# Patient Record
Sex: Female | Born: 1970 | Race: White | Hispanic: No | Marital: Married | Smoking: Never smoker
Health system: Southern US, Community
[De-identification: ages and names within clinical notes are randomized; demographics above are authoritative.]

---

## 2010-12-29 ENCOUNTER — Ambulatory Visit: Payer: Self-pay | Admitting: Sports Medicine

## 2011-02-08 ENCOUNTER — Ambulatory Visit (INDEPENDENT_AMBULATORY_CARE_PROVIDER_SITE_OTHER): Payer: Commercial Indemnity | Admitting: Sports Medicine

## 2011-02-08 VITALS — BP 122/70 | Ht 73.0 in | Wt 142.0 lb

## 2011-02-08 DIAGNOSIS — M25572 Pain in left ankle and joints of left foot: Secondary | ICD-10-CM | POA: Insufficient documentation

## 2011-02-08 DIAGNOSIS — M19079 Primary osteoarthritis, unspecified ankle and foot: Secondary | ICD-10-CM

## 2011-02-08 DIAGNOSIS — M25579 Pain in unspecified ankle and joints of unspecified foot: Secondary | ICD-10-CM

## 2011-02-08 DIAGNOSIS — M19072 Primary osteoarthritis, left ankle and foot: Secondary | ICD-10-CM

## 2011-02-08 DIAGNOSIS — G90529 Complex regional pain syndrome I of unspecified lower limb: Secondary | ICD-10-CM | POA: Insufficient documentation

## 2011-02-08 MED ORDER — AMITRIPTYLINE HCL 25 MG PO TABS: 25.0000 mg | ORAL_TABLET | Freq: Every day | ORAL | Status: DC

## 2011-02-08 NOTE — Assessment & Plan Note (Signed)
This needs to be followed carefully  I do not think this is likely to be a systemic form of arthritis but that does remain a possibility  If she has other joint involvement then the use of disease modifying agents might be warranted  I would like to recheck in one month

## 2011-02-08 NOTE — Progress Notes (Signed)
  Subjective:    Patient ID: Amy Brady, female    DOB: Jun 12, 1970, 40 y.o.   MRN: [id number]  HPI This patient is referred courtesy of Dr. Sunday Corn She has chronic left foot and ankle pain as documented below She currently is under evaluation by rheumatologist All blood tests for chronic rheumatoid arthritis lupus and other conditions have been negative She is considered for treatment for a seronegative arthritis but comes for another opinion to see if there may be another reason for her foot and ankle pain  L FOOT/ANKLE PAIN - Started appx 6-7 years ago after being up and down on a latter staining deck - Had pain on/off after this - On occasion would tweak ankle -- would have shooting pains in achilles area - Has had worsening of symptoms over the last 6 months - 6 months ago -- had carpet cleaned -- tip toed across carpet -- the next day had lots of pain in foot - Tweaked again in June while doing plei -- had sharp shooting pains in achilles area -- lots of pressure in midfoot - Pain was to the point of needing crutches to resolve - Sought medical attention about one and a half years ago - Plain films were unremarkable - Had MRI in Apr 2011 --  - Current Level of Activity -- Elliptical -- No more than 10 mins  MRI is reviewed and shows extensive edema in multiple bones of the rear end midfoot along with what appears to be degenerative arthritis of the subtalar joint   Review of Systems No other joints affected No fevers/chills No wt loss Otherwise neg    Objective:   Physical Exam  GEN: NAD CV: RRR No murmur PULM: CTAB  MSK  Inspection:  - Swelling noted along lateral malleolus and over forefoot of left foot.  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

## 2011-02-08 NOTE — Assessment & Plan Note (Signed)
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

## 2011-03-09 ENCOUNTER — Ambulatory Visit (INDEPENDENT_AMBULATORY_CARE_PROVIDER_SITE_OTHER): Payer: Commercial Indemnity | Admitting: Sports Medicine

## 2011-03-09 DIAGNOSIS — M19072 Primary osteoarthritis, left ankle and foot: Secondary | ICD-10-CM

## 2011-03-09 DIAGNOSIS — M25579 Pain in unspecified ankle and joints of unspecified foot: Secondary | ICD-10-CM

## 2011-03-09 DIAGNOSIS — M19079 Primary osteoarthritis, unspecified ankle and foot: Secondary | ICD-10-CM

## 2011-03-09 DIAGNOSIS — M25572 Pain in left ankle and joints of left foot: Secondary | ICD-10-CM

## 2011-03-09 MED ORDER — AMITRIPTYLINE HCL 25 MG PO TABS: ORAL_TABLET | ORAL | Status: DC

## 2011-03-09 NOTE — Assessment & Plan Note (Signed)
I encouraged more emphasis on non weight bearing exercise  She does enough walking during day and gets ankle swelling  While I think this started as RSD she has a component of midfoot DJD now and chronic ankle effusion  Easy motion  Ice at night Warm soaks other times  Try body helix compression ankle support  Reck 1 month

## 2011-03-09 NOTE — Patient Instructions (Signed)
Please ice foot when you notice swelling  Increase your amitriptyline from 25 mg to 50 mg at bedtime  Continue working our easy range of motion in your ankle and foot  Continue using ankle sleeve when you are walking a lot   The website for the different type of ankle sleeve is www.bodyhelix.com  Consider taking devil's claw  Please follow up in 2 months.  Thank you for seeing [place] today!

## 2011-03-09 NOTE — Progress Notes (Signed)
  Subjective:    Patient ID: Amy Brady, female    DOB: 12-21-1970, 40 y.o.   MRN: [id number]  HPI Pt is here to follow-up her L ankle pain which she says is not much better. The airsport brace we prescribed was causing pain so she stopped using it. There were 3 separate incidents since her last visit with [place] that caused setback to the ankle recovery; one was reaching over to pickup a child.  From those incidents, she noted the pain is anterior and midfoot and radiating down the metatarsals. Has noticed some new plantar fascia area type pain also since the incidents.  She has noticed that using her ACE compression brace has reduced the swelling some.  Takes 2 aleve q am, and pm, and amitriptyline 25 mg qd.  Originally volleyball 20 yrs ago w some ankle sprains 7 yrs ago climbing ladder and developed sharp post ankle area shooting nerve sensation Uses custom orthotics.       Review of Systems     Objective:   Physical Exam  Moderate generalized ankle effussion on lt with warmth Lt foot exam: Has pain with rotation of mid foot No pain over forefoot  Rear foot compression does not cause pain Good Plantar flexion, and dorsiflexion strength        Assessment & Plan:

## 2011-03-09 NOTE — Assessment & Plan Note (Signed)
Sharp pain at night has improved with amitriptyline Not too sedated w this  Will bump level to 50 and maybe to 75 later

## 2012-12-18 ENCOUNTER — Telehealth: Payer: Self-pay | Admitting: *Deleted

## 2012-12-18 NOTE — Telephone Encounter (Signed)
Message copied by Jacki Cones C on Mon Dec 18, 2012  2:10 PM ------      Message from: CERESI, MELANIE L      Created: Mon Dec 18, 2012 12:30 PM      Regarding: phone message      Contact: [phone]       Pt would like a call regarding a re injury of  Her left foot about 4 weeks ago.  She wanted to talk to you before scheduling an appt.            Pt asked to call number above first then try her Cell [phone] ------

## 2012-12-18 NOTE — Telephone Encounter (Signed)
States her ankle had been more painful and swollen since returning from the beach 4 weeks ago.  She was doing all the normal treatments for it as this is a chronic problem- icing, aleve bid.  She is having trouble w/ the ankle swelling not resolving like it does normal.  She is wondering if she should restart amitriptyline.  Advised her Dr. Darrick Penna would want to re-evaluate her ankle as it has been 2 years since she has been seen here.

## 2012-12-19 MED ORDER — AMITRIPTYLINE HCL 25 MG PO TABS: 25.0000 mg | ORAL_TABLET | Freq: Every day | ORAL | Status: DC

## 2012-12-19 NOTE — Telephone Encounter (Signed)
Dr. Darrick Penna wants to see pt to evaluate.  Will work her in to schedule.  He said ok to restart amitriptyline in the meantime. Pt agreeable.

## 2012-12-20 NOTE — Telephone Encounter (Addendum)
Scheduled pt for appt with Dr. Darrick Penna 12/26/12 @ 9:45am.  Pt notified of appt info.

## 2012-12-26 ENCOUNTER — Ambulatory Visit (INDEPENDENT_AMBULATORY_CARE_PROVIDER_SITE_OTHER): Payer: Managed Care, Other (non HMO) | Admitting: Sports Medicine

## 2012-12-26 ENCOUNTER — Ambulatory Visit
Admission: RE | Admit: 2012-12-26 | Discharge: 2012-12-26 | Disposition: A | Discharge status: Home / Self Care | Payer: Managed Care, Other (non HMO) | Source: Ambulatory Visit | Attending: Sports Medicine | Admitting: Sports Medicine

## 2012-12-26 VITALS — BP 110/76 | Ht 73.0 in | Wt 150.0 lb

## 2012-12-26 DIAGNOSIS — M19072 Primary osteoarthritis, left ankle and foot: Secondary | ICD-10-CM

## 2012-12-26 DIAGNOSIS — M25572 Pain in left ankle and joints of left foot: Secondary | ICD-10-CM

## 2012-12-26 DIAGNOSIS — M19079 Primary osteoarthritis, unspecified ankle and foot: Secondary | ICD-10-CM

## 2012-12-26 DIAGNOSIS — M25579 Pain in unspecified ankle and joints of unspecified foot: Secondary | ICD-10-CM

## 2012-12-26 NOTE — Progress Notes (Signed)
  Subjective:    Patient ID: Amy Brady, female    DOB: [birth year], 42 y.o.   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   Patient aggravated her ankle while walking on the beach in flip flops and barefoot 5 weeks ago Patient noted significant pain and swelling in her foot and ankle 1-2 days after Pain is deep within the subtalar joint Since that time her ankle still hurts with certain movements and the swelling has persisted Patient has been using naproxen 440 bid with occasional tylenol and ibuprofen Pt stopped using ankle compression sleeve due to increased forefoot swelling No numbness or tingling  She called and started amitriptyline 1 week ago and feels that she's had some benefit in both pain and swelling since then 2 years ago when she had very similar symptoms and had an abnormal MRI showing edema in several tarsal bones as well as an effusion of the ankle, she responded very well to the amitriptyline. In fact she went 2 years where she was able to do a lot of walking even some jogging and she remained active without much pain.    Review of Systems Per HPI     Objective:   Physical Exam General: healthy fit appearing woman who looks younger than stated age.  L foot: Appearance: grossly edemetous ankle joint, mid lateral forefoot erythema Palpation: AITFL, ATFL, CFL, PTFL, PITL non tender to palpation. Anterior tibialis tendon moderately tender to palpation, deltoid ligament non tender, Lis Franc joint mild hypertrophy at dorsum and tender with palpation. pROM: pronation/supination limited to 10-15 degrees along the subtalar joint. Dorsiflexion/plantarflexion along talocrural joint wnl. AROM: pain with resisted inversion and eversion. 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 Patient has a  significant joint effusion limiting her range of motion  -Motion exercises for ankle every day  -Use compression as much as tolerated and particularly for standing or walking  -If too much swelling in foot - take off for 1 to 2 hours  -Keep up Aleve  -Keep up the amitriptyline  -Xray of the left foot, complete  F/u in 3 weeks  -Ice and warm foot baths with motion prn -Self massage is fine  Pt care seen and discussed with Dr. Doreene Eland, PGY-3

## 2012-12-26 NOTE — Assessment & Plan Note (Addendum)
Based on today's XRay I am unsure that she has significant DJD of foot  DJD DX from MRI may have been an over-read with the bone edema from RSD

## 2012-12-26 NOTE — Assessment & Plan Note (Signed)
The response to amitriptyline strongly suggests that RSD diagnosis is likely  We will follow plan outlined in visit note

## 2012-12-26 NOTE — Patient Instructions (Addendum)
Motion exercises for ankle every day  Use compression as much as tolerated and particularly for standing or walking If too much swelling in foot - take off for 1 to 2 hours  Keep up Aleve  Keep up the amitriptyline  We will call you with Xray result  I need to see you in 3 weeks  Ice may help but see how you respond because Ice irritates nerve issues Second option are warm foot baths with motion  Self massage is fine

## 2013-01-18 ENCOUNTER — Ambulatory Visit (INDEPENDENT_AMBULATORY_CARE_PROVIDER_SITE_OTHER): Payer: Managed Care, Other (non HMO) | Admitting: Sports Medicine

## 2013-01-18 ENCOUNTER — Encounter: Payer: Self-pay | Admitting: Sports Medicine

## 2013-01-18 VITALS — BP 125/77 | HR 61 | Ht 73.0 in | Wt 150.0 lb

## 2013-01-18 DIAGNOSIS — M19072 Primary osteoarthritis, left ankle and foot: Secondary | ICD-10-CM

## 2013-01-18 DIAGNOSIS — M25579 Pain in unspecified ankle and joints of unspecified foot: Secondary | ICD-10-CM

## 2013-01-18 DIAGNOSIS — M19079 Primary osteoarthritis, unspecified ankle and foot: Secondary | ICD-10-CM

## 2013-01-18 DIAGNOSIS — M25572 Pain in left ankle and joints of left foot: Secondary | ICD-10-CM

## 2013-01-18 NOTE — Patient Instructions (Addendum)
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

## 2013-01-18 NOTE — Assessment & Plan Note (Addendum)
No evidence of DJD on Xray This appears to be reactive  Her current exam shows basically a frozen ankle She is having minimal pain but with motion of the ankle she has significant pain when she goes past 50% of normal range  I think she needs to have a long-term process to restore normal ankle function Range of motion exercises Compression Amitriptyline Vitamin C  For sports we need to make her a cushioned orthotic and get her out of rigid orthotics and we will do that on her followup visit in 6 weeks

## 2013-01-18 NOTE — Assessment & Plan Note (Signed)
Today's exam points more toward ankle issues and not foot  90% of pain has resolved with minimal use of medicines

## 2013-01-18 NOTE — Progress Notes (Signed)
  Subjective:    Patient ID: Amy Brady, female    DOB: March 29, 1971, 42 y.o.   MRN: [id number]  HPI  Pt presents to clinic for f/u of left foot and ankle pain which is resolving.  Pain started after beach trip where she walked in sand wearing flip flops Pain is deep within the subtalar joint, and is short twinges of intense pain.  X-rays of area did not show arthritis.   She stopped amitriptyline 2 weeks ago once pain and swelling were almost resolved.  Full ankle sleeve was helpful, used this for 3 weeks for support.  Takes Aleve BID.   She had similar process years ago after volleyball injury   Review of Systems No thyroid, DM or other endocrine sxs    Objective:   Physical Exam  NAD/ tall and thin  Sternum feels normal SI joints move well FABER normal bilat Hip ROM good bilat  Ankle exam TTP on inversion Slight swelling in lt sinus tarsi 40% normal plantar flexion inversion on lt, 50% normal on rt 50 % normal dorsi and plantar flexion on lt   Foot is normal today   Arm span - 72" Palms touch the floor Elbows hyperextend 3- beighten score      Assessment & Plan:

## 2013-02-20 ENCOUNTER — Ambulatory Visit (INDEPENDENT_AMBULATORY_CARE_PROVIDER_SITE_OTHER): Payer: Managed Care, Other (non HMO) | Admitting: Sports Medicine

## 2013-02-20 ENCOUNTER — Encounter: Payer: Self-pay | Admitting: Sports Medicine

## 2013-02-20 VITALS — BP 113/76 | Ht 73.0 in | Wt 150.0 lb

## 2013-02-20 DIAGNOSIS — G90529 Complex regional pain syndrome I of unspecified lower limb: Secondary | ICD-10-CM

## 2013-02-20 DIAGNOSIS — M25572 Pain in left ankle and joints of left foot: Secondary | ICD-10-CM

## 2013-02-20 DIAGNOSIS — G90522 Complex regional pain syndrome I of left lower limb: Secondary | ICD-10-CM

## 2013-02-20 DIAGNOSIS — M25579 Pain in unspecified ankle and joints of unspecified foot: Secondary | ICD-10-CM

## 2013-02-20 NOTE — Assessment & Plan Note (Signed)
This is much improved and evidence seems to point to RSD that started after sprain Uses occasional OTC NSAIDS

## 2013-02-20 NOTE — Assessment & Plan Note (Signed)
Steady improvement  Needs to get to normal ROM - now at 70% of normal  See plan  Doing well

## 2013-02-20 NOTE — Patient Instructions (Signed)
Keep up Ca and Vit D Keep up Vit C  Keep up amitriptyline  Gradually increase activity with even some jogging  Keep up motion and 1 foot balance exercises  See me in 3 months

## 2013-02-20 NOTE — Progress Notes (Signed)
Patient ID: Amy Brady, female   DOB: [birth year], 42 y.o.   MRN: [id number]  Now pain is only with accidental bump of left ankle Walking no pain Sleeping no pain Catches and stresses foot causes sharp 6/10 pain that resolves  No swelling now Has not noticed color change  Ankle support - uses for hiking but not in last 2 weeks for ADLs  1 foot balance is better/ doing exercises   Exam  NAD  No swelling or color change  Drawer is now stable  Left ankle is now 25 PF and inversion vs 45 Rt Dorsiflexion is now 20 Lt and 30 RT PF 40 RT and 35 LT Eversion 7 LT and 15 RT  Walking gait is neutral  Running gait with sports insole with no limp No real pronation Not sure needs custom orthotics

## 2013-03-07 ENCOUNTER — Other Ambulatory Visit: Payer: Self-pay | Admitting: Sports Medicine

## 2013-06-14 ENCOUNTER — Ambulatory Visit: Payer: Managed Care, Other (non HMO) | Admitting: Sports Medicine

## 2013-07-08 ENCOUNTER — Other Ambulatory Visit: Payer: Self-pay | Admitting: Sports Medicine

## 2013-07-24 ENCOUNTER — Ambulatory Visit (INDEPENDENT_AMBULATORY_CARE_PROVIDER_SITE_OTHER): Payer: Managed Care, Other (non HMO) | Admitting: Sports Medicine

## 2013-07-24 ENCOUNTER — Encounter: Payer: Self-pay | Admitting: Sports Medicine

## 2013-07-24 VITALS — BP 114/76 | Ht 73.0 in | Wt 150.0 lb

## 2013-07-24 DIAGNOSIS — M25579 Pain in unspecified ankle and joints of unspecified foot: Secondary | ICD-10-CM

## 2013-07-24 DIAGNOSIS — G90529 Complex regional pain syndrome I of unspecified lower limb: Secondary | ICD-10-CM

## 2013-07-24 DIAGNOSIS — M25572 Pain in left ankle and joints of left foot: Secondary | ICD-10-CM

## 2013-07-24 MED ORDER — AMITRIPTYLINE HCL 25 MG PO TABS: 25.0000 mg | ORAL_TABLET | Freq: Every day | ORAL | Status: AC

## 2013-07-24 NOTE — Assessment & Plan Note (Signed)
Pain is now only intermittent and mild  Cont OTC aleve

## 2013-07-24 NOTE — Assessment & Plan Note (Signed)
Cont Amitriptyline Cont balance and ROM exercises Compression for high level activity  Reck 6 mos

## 2013-07-24 NOTE — Progress Notes (Signed)
Patient ID: Amy Brady, female   DOB: 03/11/1971, 43 y.o.   MRN: [id number]  Left Ankle RSD - started 2006 and major flare in 2008 Hx of thyroid issues Doing well with more activity Not much reactive swelling now Careful w beach walking  Now use amitritptyline at night Aleve prn Heat does help  Still uses ankle compression when activity high  Uses orthotics  Exam  RT ankle  Lt ankle PFI is 50 deg    30 DF 15  12 Ever 20  10 PF 30  28  SubT   10  7  Ligaments Mod laxity on RT Still tight on left  Gait is neutral now bilat with walking and running

## 2014-09-05 IMAGING — CR DG ANKLE 2V *L*
2 series · 2 of 2 positions shown · non-contrast
Comparison: None.

CLINICAL DATA: Medial left ankle pain, no acute injury

EXAM:
LEFT ANKLE - 2 VIEW

[t ankle joint ap left]
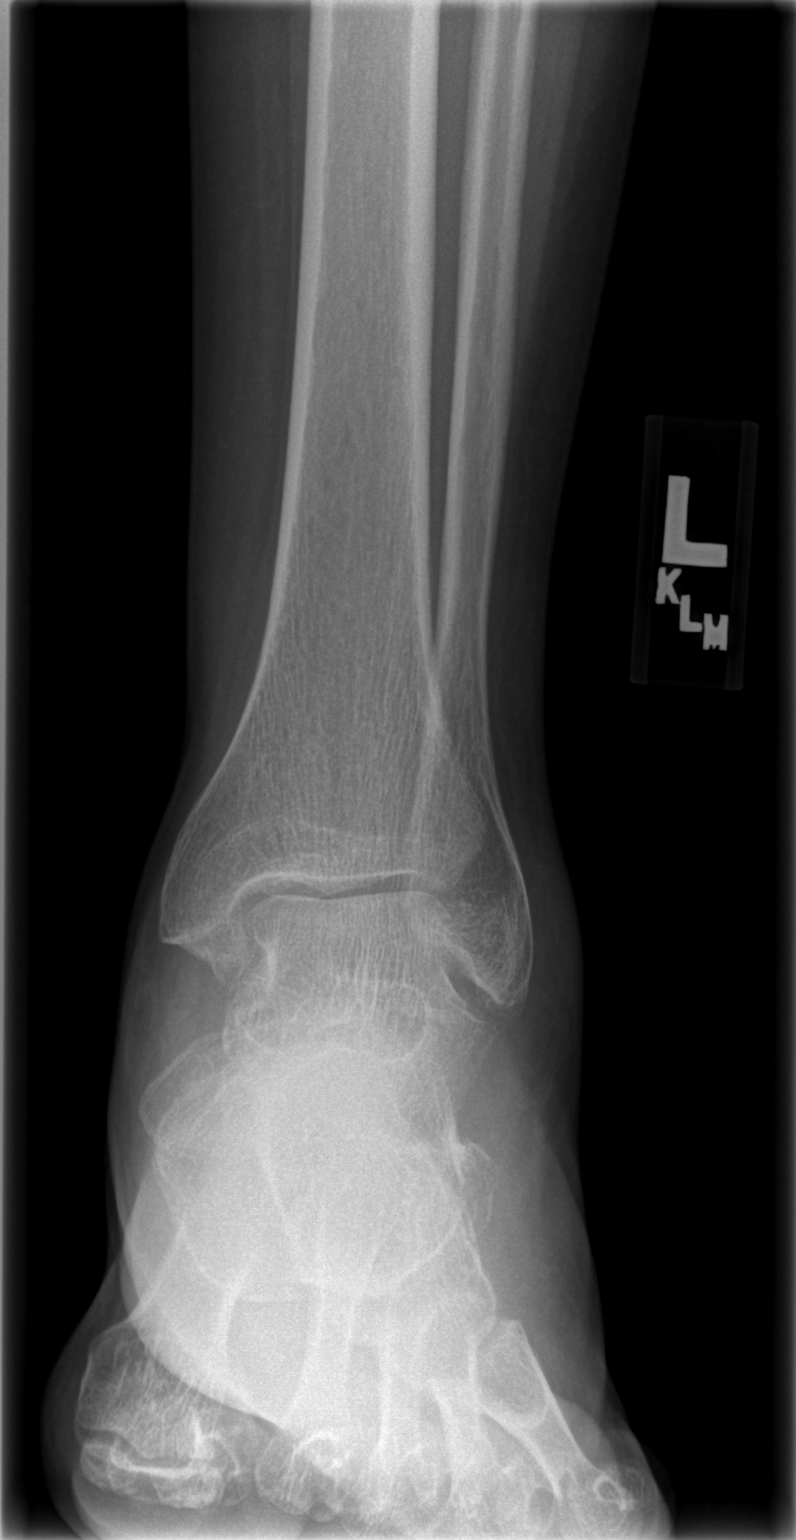

[t ankle joint lat left]
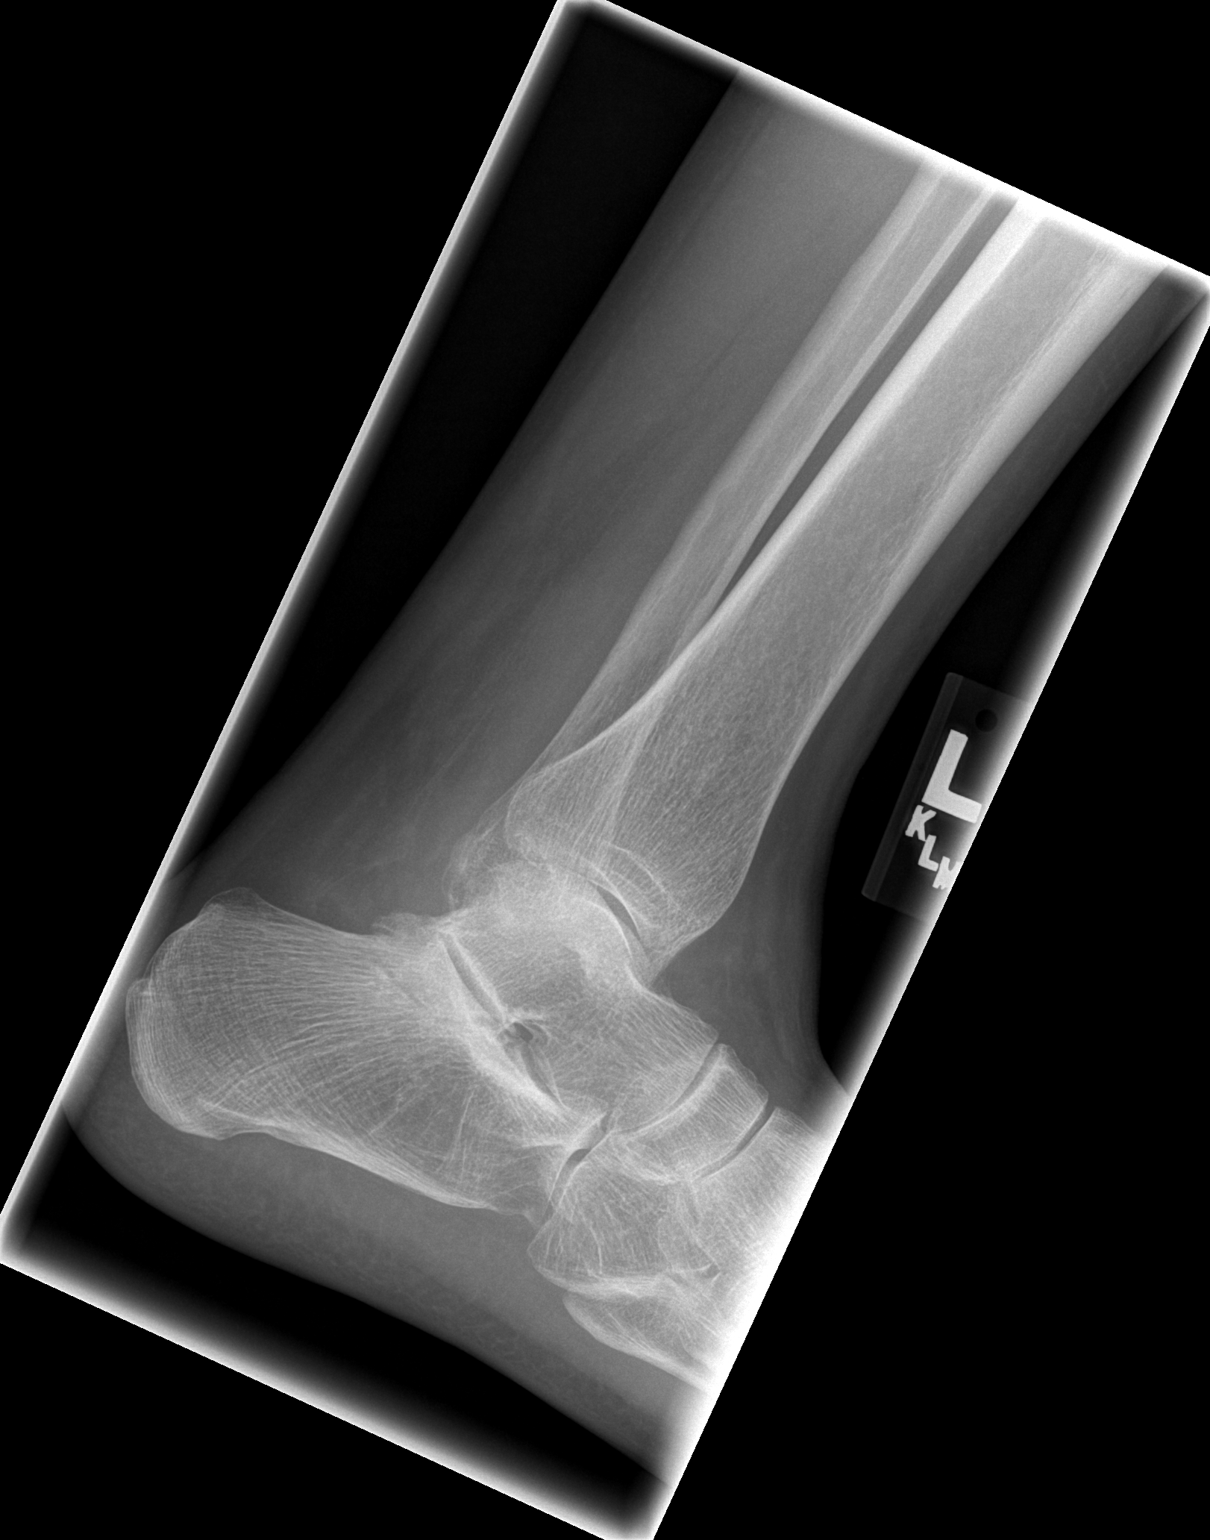

[2 of 2 positions shown; findings below may reference images not displayed]

FINDINGS: No acute fracture is seen. The ankle joint appears normal. Alignment
is normal.
IMPRESSION: Negative.
